# Patient Record
Sex: Female | Born: 1950 | Race: White | Hispanic: No | Marital: Single
Health system: Southern US, Community
[De-identification: ages and names within clinical notes are randomized; demographics above are authoritative.]

---

## 2007-10-01 ENCOUNTER — Encounter: Admission: RE | Admit: 2007-10-01 | Discharge: 2007-10-01 | Payer: Self-pay | Admitting: Cardiology

## 2017-08-13 ENCOUNTER — Other Ambulatory Visit: Payer: Self-pay | Admitting: Obstetrics and Gynecology

## 2017-08-13 DIAGNOSIS — N644 Mastodynia: Secondary | ICD-10-CM

## 2017-08-19 ENCOUNTER — Ambulatory Visit
Admission: RE | Admit: 2017-08-19 | Discharge: 2017-08-19 | Disposition: A | Discharge status: Home / Self Care | Payer: Medicare Other | Source: Ambulatory Visit | Attending: Obstetrics and Gynecology | Admitting: Obstetrics and Gynecology

## 2017-08-19 ENCOUNTER — Other Ambulatory Visit: Payer: Self-pay | Admitting: Obstetrics and Gynecology

## 2017-08-19 DIAGNOSIS — N644 Mastodynia: Secondary | ICD-10-CM

## 2018-04-22 ENCOUNTER — Other Ambulatory Visit: Payer: Self-pay | Admitting: Cardiology

## 2018-04-22 DIAGNOSIS — R079 Chest pain, unspecified: Secondary | ICD-10-CM

## 2018-04-22 DIAGNOSIS — R9439 Abnormal result of other cardiovascular function study: Secondary | ICD-10-CM

## 2018-04-22 DIAGNOSIS — R9430 Abnormal result of cardiovascular function study, unspecified: Secondary | ICD-10-CM

## 2018-05-21 ENCOUNTER — Ambulatory Visit (HOSPITAL_COMMUNITY)
Admission: RE | Admit: 2018-05-21 | Discharge: 2018-05-21 | Disposition: A | Discharge status: Home / Self Care | Payer: Medicare Other | Source: Ambulatory Visit | Attending: Cardiology | Admitting: Cardiology

## 2018-05-21 DIAGNOSIS — R9439 Abnormal result of other cardiovascular function study: Secondary | ICD-10-CM

## 2018-05-21 DIAGNOSIS — R079 Chest pain, unspecified: Secondary | ICD-10-CM | POA: Diagnosis present

## 2018-05-21 DIAGNOSIS — R9430 Abnormal result of cardiovascular function study, unspecified: Secondary | ICD-10-CM | POA: Diagnosis not present

## 2018-05-21 MED ORDER — METOPROLOL TARTRATE 5 MG/5ML IV SOLN
5.0000 mg | INTRAVENOUS | Status: DC | PRN
  Administered 2018-05-21: 5 mg via INTRAVENOUS
  Filled 2018-05-21: qty 5

## 2018-05-21 MED ORDER — NITROGLYCERIN 0.4 MG SL SUBL
0.8000 mg | SUBLINGUAL_TABLET | Freq: Once | SUBLINGUAL | Status: AC
  Administered 2018-05-21: 0.8 mg via SUBLINGUAL
  Filled 2018-05-21: qty 25

## 2018-05-21 MED ORDER — IOPAMIDOL (ISOVUE-370) INJECTION 76%
100.0000 mL | Freq: Once | INTRAVENOUS | Status: AC | PRN
  Administered 2018-05-21: 80 mL via INTRAVENOUS

## 2018-05-21 MED ORDER — NITROGLYCERIN 0.4 MG SL SUBL
SUBLINGUAL_TABLET | SUBLINGUAL | Status: AC
  Filled 2018-05-21: qty 2

## 2018-05-21 MED ORDER — METOPROLOL TARTRATE 5 MG/5ML IV SOLN
INTRAVENOUS | Status: AC
  Filled 2018-05-21: qty 5

## 2018-05-21 NOTE — Progress Notes (Signed)
Ct complete. Patient drinking coffee and eating crackers. Denies any complaints.

## 2018-05-25 LAB — POCT I-STAT CREATININE: Creatinine, Ser: 0.7 mg/dL (ref 0.44–1.00)

## 2018-10-29 ENCOUNTER — Other Ambulatory Visit: Payer: Self-pay

## 2018-10-29 MED ORDER — SPIRONOLACTONE 25 MG PO TABS: 25.0000 mg | ORAL_TABLET | Freq: Every day | ORAL | 1 refills | Status: AC

## 2019-03-12 ENCOUNTER — Other Ambulatory Visit: Payer: Self-pay

## 2019-03-12 MED ORDER — VALSARTAN-HYDROCHLOROTHIAZIDE 160-25 MG PO TABS: 1.0000 | ORAL_TABLET | Freq: Every day | ORAL | 1 refills | Status: DC

## 2019-05-25 ENCOUNTER — Other Ambulatory Visit: Payer: Self-pay | Admitting: Cardiology

## 2019-07-28 ENCOUNTER — Other Ambulatory Visit: Payer: Self-pay | Admitting: Cardiology

## 2019-08-14 IMAGING — MG 2D DIGITAL DIAGNOSTIC BILATERAL MAMMOGRAM WITH CAD AND ADJUNCT T
8 of 16 series · 8 of 36 positions shown · non-contrast
Comparison: Mammography 06/17/2016, 05/24/2015 and earlier.

CLINICAL DATA: Six-month history of intermittent pain in the outer
left breast at the 3 o'clock position. Intermittent soreness in the
upper inner right breast which the patient relates to referred pain
from the right shoulder. Annual evaluation.

EXAM:
2D DIGITAL DIAGNOSTIC BILATERAL MAMMOGRAM WITH CAD AND ADJUNCT TOMO
ULTRASOUND LEFT BREAST

[R CC]
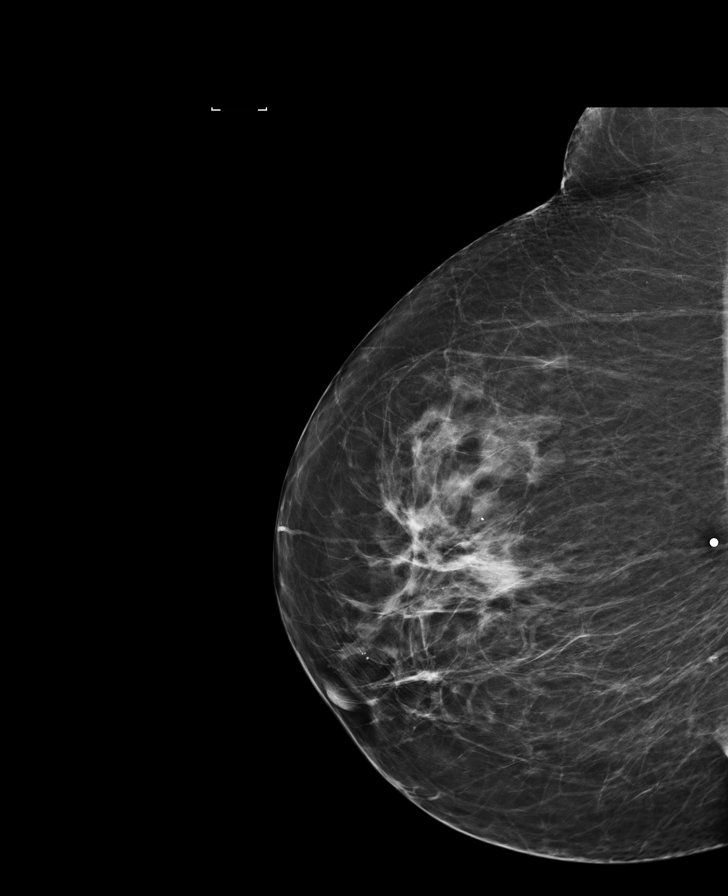

[R CC synth-2D]
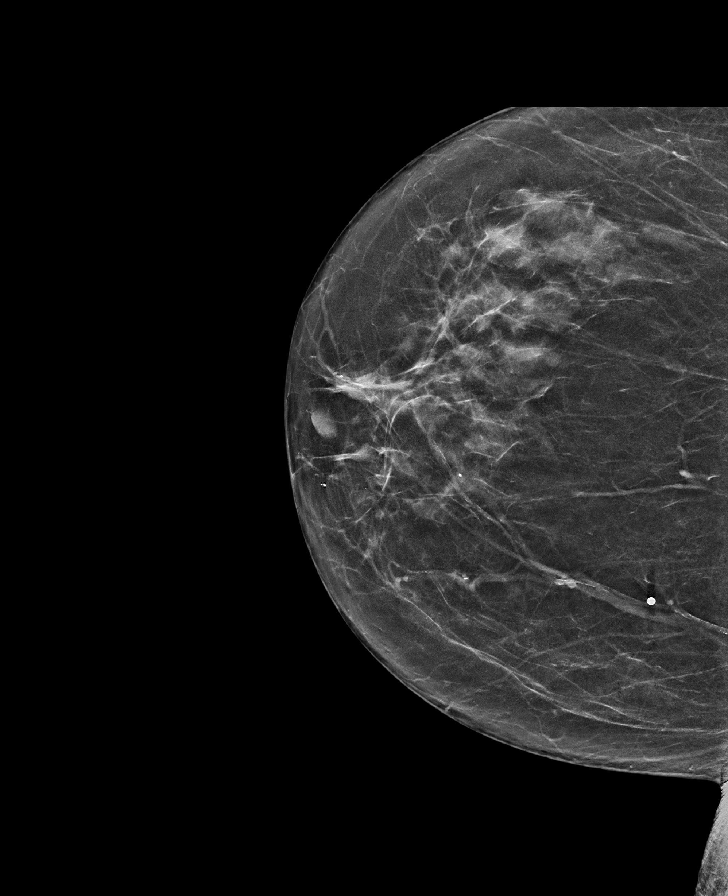

[L TAN]
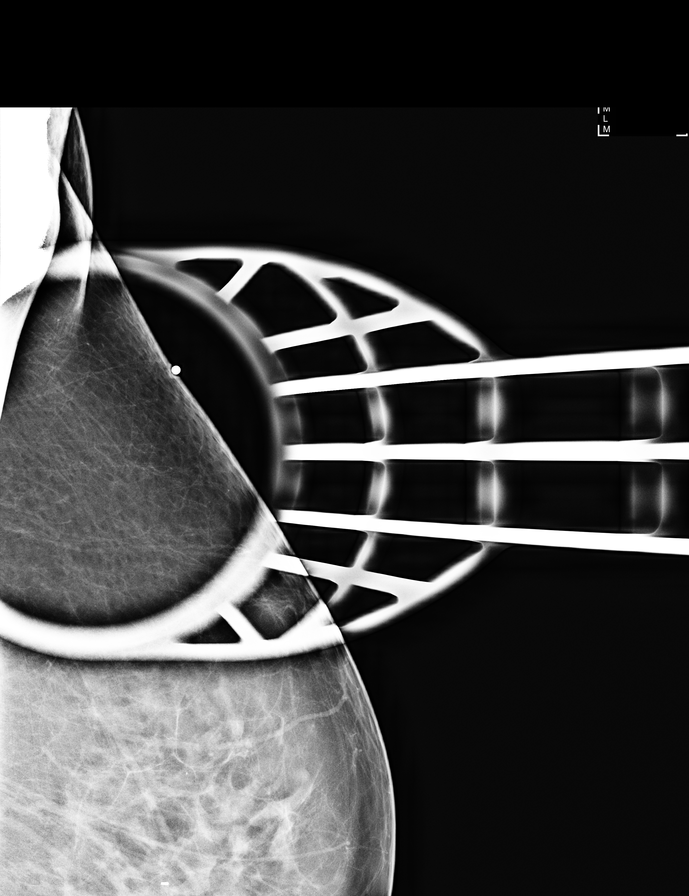

[R TAN synth-2D]
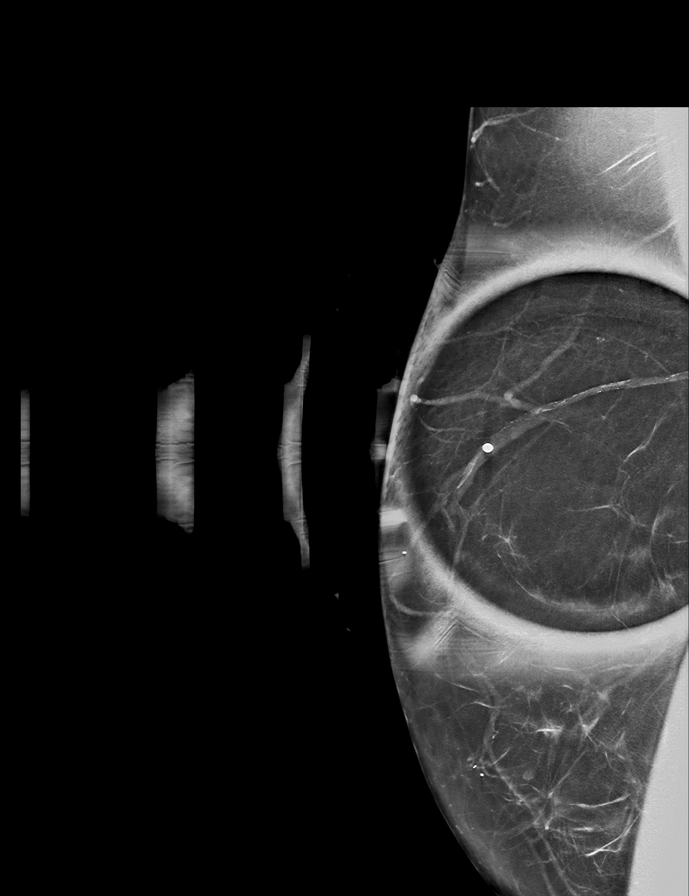

[L MLO]
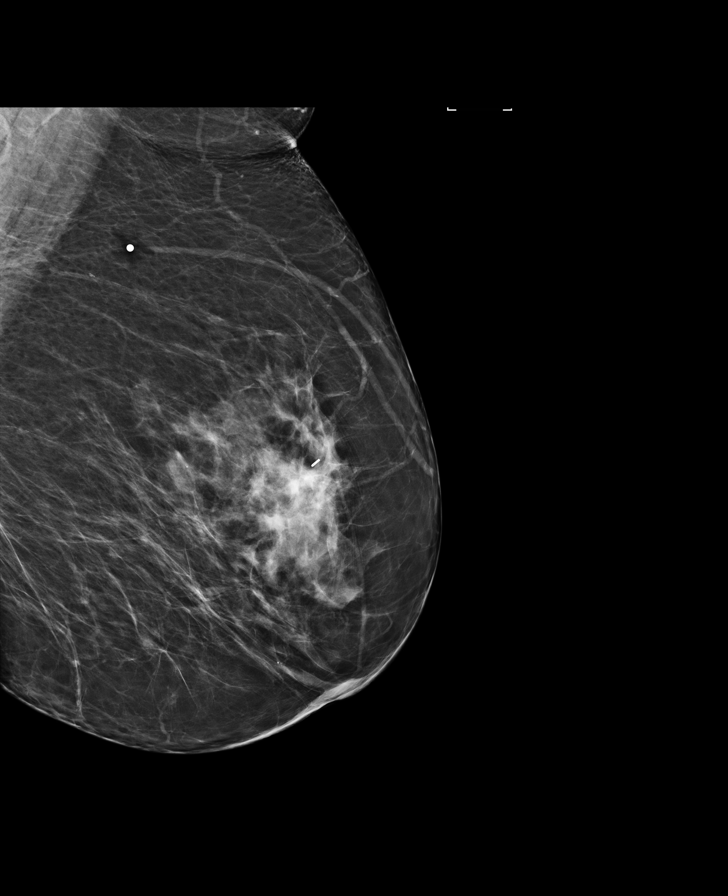

[L TAN synth-2D]
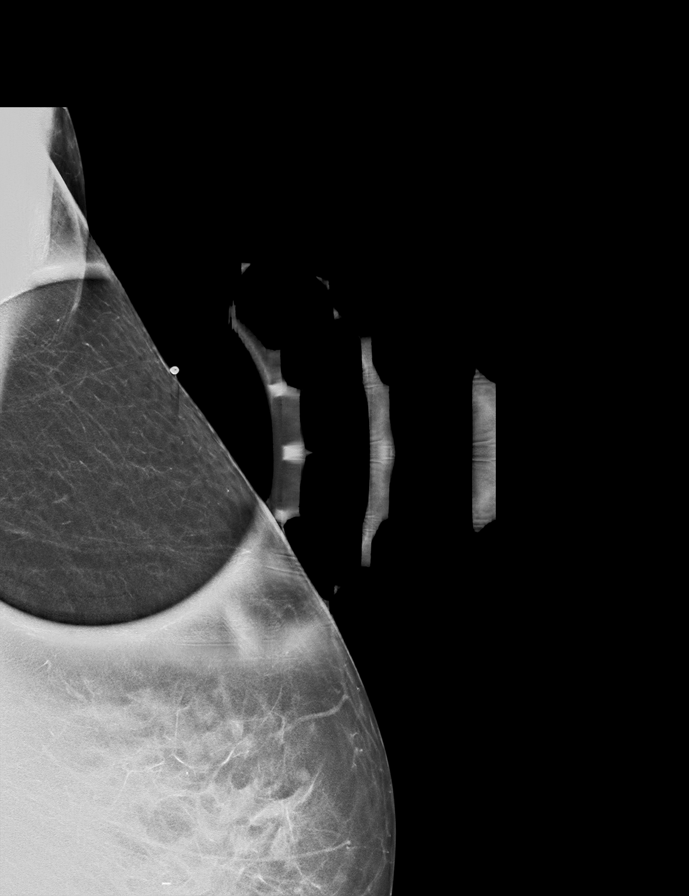

[R MLO]
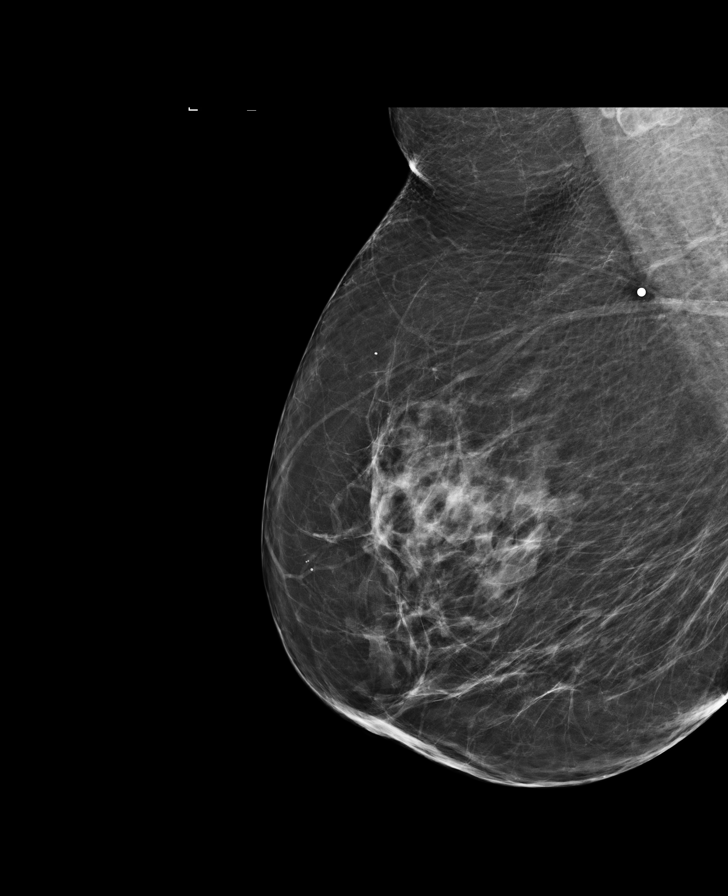

[L MLO synth-2D]
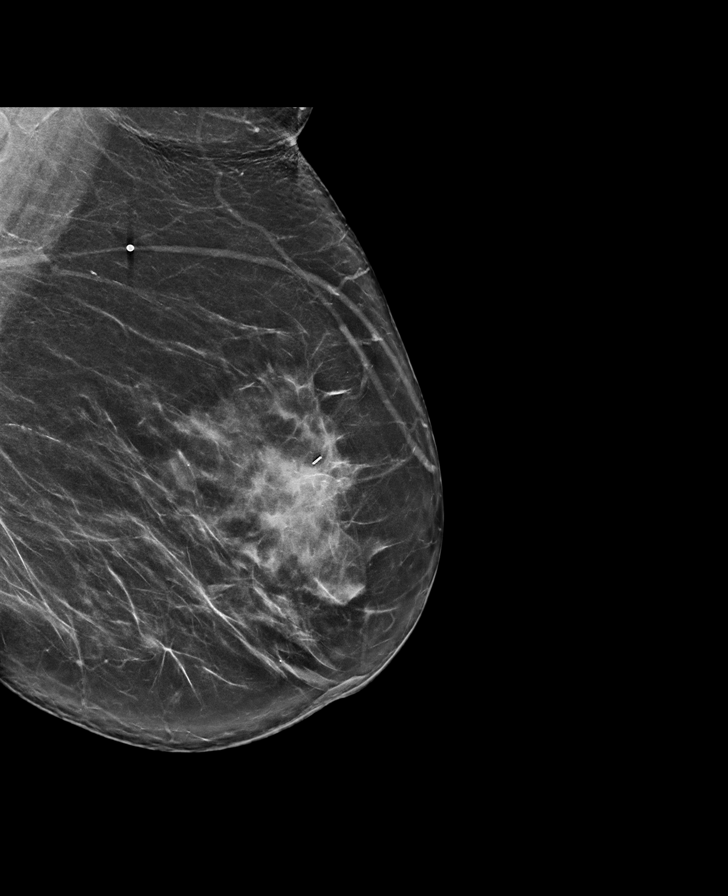

[8 of 36 positions shown; findings below may reference images not displayed]

No
prior ultrasound.

ACR Breast Density Category b: There are scattered areas of
fibroglandular density.
FINDINGS: Standard 2D and tomosynthesis full field CC and MLO views of both
breasts were obtained. Standard and tomosynthesis spot tangential
views of the focal pain in the left breast and the soreness in the
right breast were also obtained.

No mammographic abnormality in the area of focal pain in the outer
left breast. No findings suspicious for malignancy in the left
breast.

No mammographic abnormality in the area of soreness in the upper
inner right breast. No findings suspicious for malignancy in the
right breast.

Mammographic images were processed with CAD.

On physical exam, there is no palpable abnormality in the far outer
left breast in the area of focal pain. The patient does describe
tenderness to deep palpation.

Targeted left breast ultrasound is performed, showing normal
fibrofatty tissue in the far outer breast in the area of focal pain.
The serratus anterior muscles and the lateral left ribs underlie the
area of focal pain.
IMPRESSION: 1. No mammographic or sonographic evidence of malignancy involving
the left breast.
2. No mammographic abnormality involving the right breast.
3. As the left serratus anterior muscles and the lateral left ribs
underlie the area of focal pain in the far outer left breast,
perhaps the pain is musculoskeletal and/or related to her stated
history of fibromyalgia.

RECOMMENDATION:
Screening mammogram in one year.(Code:R3-D-CCY)

I have discussed the findings and recommendations with the patient.
Results were also provided in writing at the conclusion of the
visit. If applicable, a reminder letter will be sent to the patient
regarding the next appointment.

BI-RADS CATEGORY  1: Negative.

## 2020-03-08 ENCOUNTER — Other Ambulatory Visit: Payer: Self-pay

## 2020-03-08 MED ORDER — VALSARTAN-HYDROCHLOROTHIAZIDE 160-25 MG PO TABS: 1.0000 | ORAL_TABLET | Freq: Every day | ORAL | 0 refills | Status: AC

## 2020-05-15 IMAGING — CT CT HEART MORP W/ CTA COR W/ SCORE W/ CA W/CM &/OR W/O CM
4 of 7 series · 8 of 20 positions shown, 9 images · IV contrast (APPLIED)
Comparison: none

CLINICAL DATA: Chest pain

EXAM:
Cardiac CTA
MEDICATIONS:
Sub lingual nitro. 4mg and lopressor 10mg
TECHNIQUE: The patient was scanned on a Siem[employer] Force [REDACTED]ice scanner. Gantry
rotation speed was 250 msecs. Collimation was .6 mm. A 100 kV
prospective scan was triggered in the ascending thoracic aorta at
140 HU's Full mA was used between 35% and 75% of the R-R interval.
Average HR during the scan was 66 bpm. The 3D data set was
interpreted on a dedicated work station using MPR, MIP and VRT
modes. A total of 80 cc of contrast was used.

[Series 6: best diast 72 % · axial · 0.30mm/px · z∈[+980,+1015]mm · 2 of 258 slices shown, 3 images]
[im 86/258  vessel]
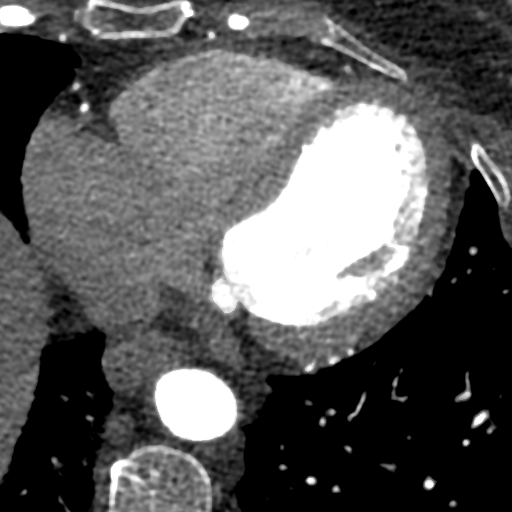
[im 86/258  lung]
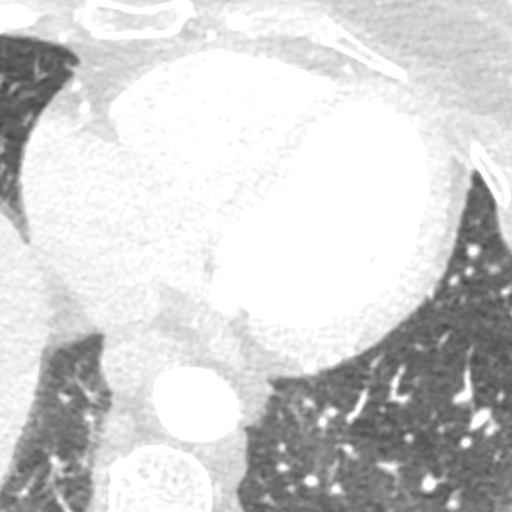
[im 172/258  vessel]
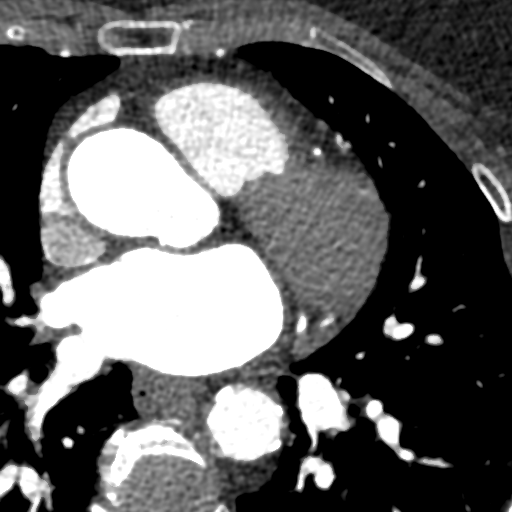

[Series 7: best syst 40 % · axial · 0.30mm/px · z∈[+980,+1015]mm · 2 of 258 slices shown]
[im 86/258  vessel]
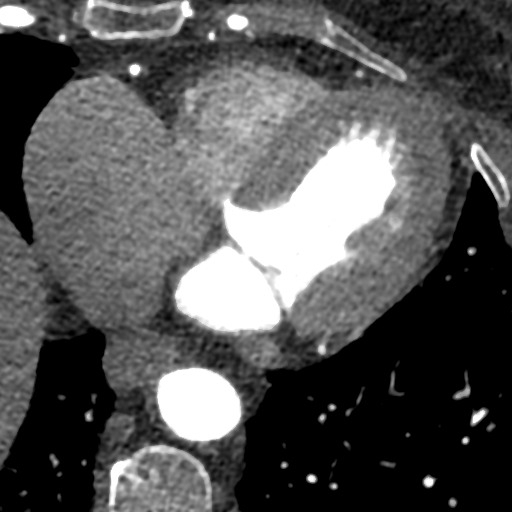
[im 172/258  vessel]
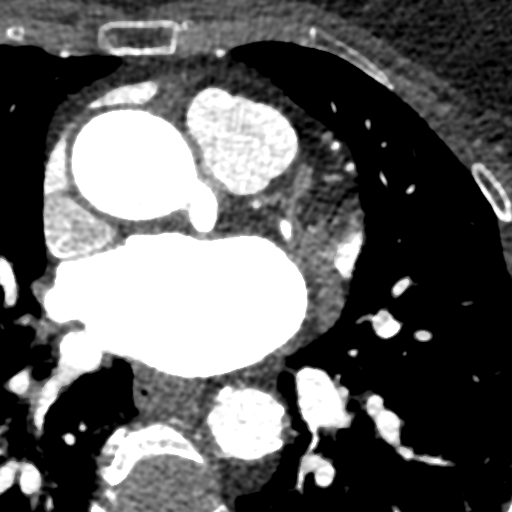

[Series 8: ts diast sharp 72 % · axial · 0.30mm/px · z∈[+980,+1015]mm · 2 of 258 slices shown]
[im 86/258  lung]
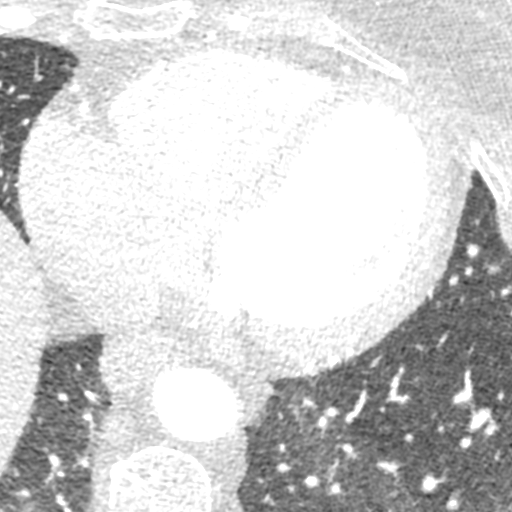
[im 172/258  lung]
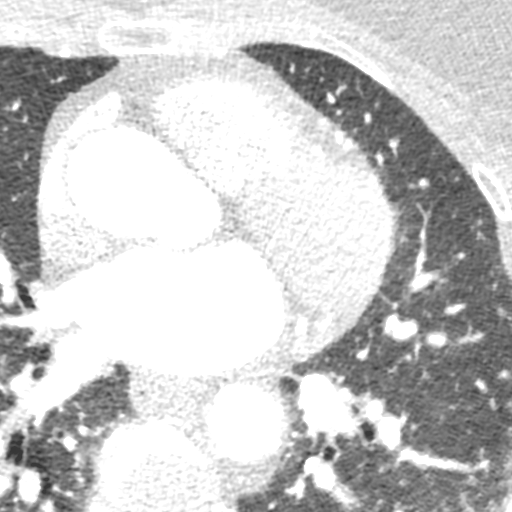

[Series 9: ts syst sharp 40 % · axial · 0.30mm/px · z∈[+980,+1015]mm · 2 of 258 slices shown]
[im 86/258  lung]
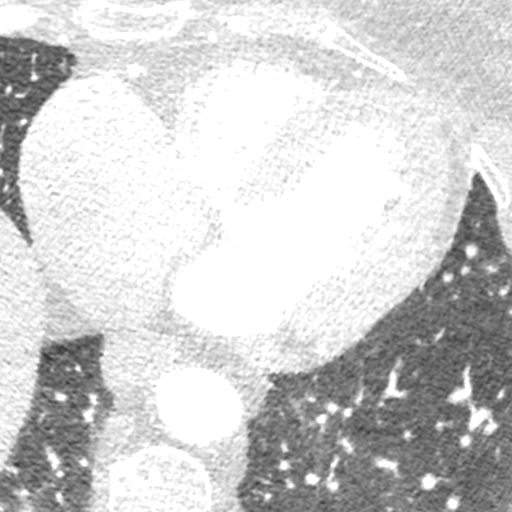
[im 172/258  lung]
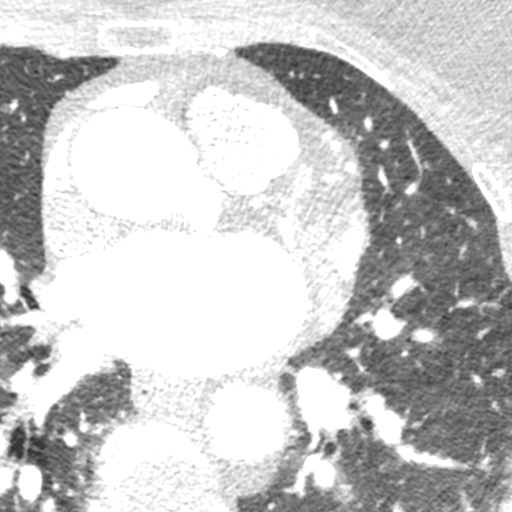

[8 of 20 positions shown; findings below may reference images not displayed]

FINDINGS: Non-cardiac: See separate report from [REDACTED]. No
significant findings on limited lung and soft tissue windows.

Calcium Score: No calcium detected

Coronary Arteries: Right dominant with no anomalies

LM: Normal

LAD: Normal

IM: Small branch normal

D1: Large branch normal

Circumflex: Normal

OM1: Normal

OM2: Normal

RCA: Normal

PDA: Normal

PLA: Normal
IMPRESSION: 1. Normal right dominant coronary arteries

2.  Calcium score 0

3.  Normal aortic root 3.6 cm

Joesph Neyra

## 2020-06-01 ENCOUNTER — Other Ambulatory Visit: Payer: Self-pay | Admitting: Cardiology
